# Patient Record
Sex: Male | Born: 1970 | Race: White | Hispanic: No | Marital: Married | Smoking: Never smoker
Health system: Southern US, Community
[De-identification: ages and names within clinical notes are randomized; demographics above are authoritative.]

## PROBLEM LIST (undated history)

## (undated) DIAGNOSIS — F32A Depression, unspecified: Secondary | ICD-10-CM

## (undated) DIAGNOSIS — F329 Major depressive disorder, single episode, unspecified: Secondary | ICD-10-CM

## (undated) HISTORY — DX: Major depressive disorder, single episode, unspecified: F32.9

## (undated) HISTORY — DX: Depression, unspecified: F32.A

## (undated) HISTORY — PX: OTHER SURGICAL HISTORY: SHX169

---
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
## 1998-11-13 HISTORY — PX: OTHER SURGICAL HISTORY: SHX169

## 2002-12-02 ENCOUNTER — Encounter: Payer: Self-pay | Admitting: Emergency Medicine

## 2002-12-02 ENCOUNTER — Emergency Department (HOSPITAL_COMMUNITY): Admission: AC | Admit: 2002-12-02 | Discharge: 2002-12-02 | Payer: Self-pay

## 2006-08-14 ENCOUNTER — Encounter: Payer: Self-pay | Admitting: Family Medicine

## 2006-08-15 ENCOUNTER — Ambulatory Visit: Payer: Self-pay | Admitting: Family Medicine

## 2006-09-22 ENCOUNTER — Ambulatory Visit: Payer: Self-pay | Admitting: Family Medicine

## 2006-09-22 DIAGNOSIS — T50995A Adverse effect of other drugs, medicaments and biological substances, initial encounter: Secondary | ICD-10-CM | POA: Insufficient documentation

## 2006-10-27 ENCOUNTER — Ambulatory Visit: Payer: Self-pay | Admitting: Family Medicine

## 2006-11-24 ENCOUNTER — Ambulatory Visit: Payer: Self-pay | Admitting: Family Medicine

## 2006-12-06 ENCOUNTER — Encounter (INDEPENDENT_AMBULATORY_CARE_PROVIDER_SITE_OTHER): Payer: Self-pay | Admitting: Internal Medicine

## 2006-12-20 ENCOUNTER — Ambulatory Visit: Payer: Self-pay | Admitting: Family Medicine

## 2006-12-20 DIAGNOSIS — B9789 Other viral agents as the cause of diseases classified elsewhere: Secondary | ICD-10-CM | POA: Insufficient documentation

## 2007-09-25 ENCOUNTER — Ambulatory Visit: Payer: Self-pay | Admitting: Family Medicine

## 2007-09-26 ENCOUNTER — Telehealth (INDEPENDENT_AMBULATORY_CARE_PROVIDER_SITE_OTHER): Payer: Self-pay | Admitting: Internal Medicine

## 2007-11-02 ENCOUNTER — Ambulatory Visit: Payer: Self-pay | Admitting: Family Medicine

## 2008-07-17 ENCOUNTER — Ambulatory Visit: Payer: Self-pay | Admitting: Family Medicine

## 2008-07-17 DIAGNOSIS — L989 Disorder of the skin and subcutaneous tissue, unspecified: Secondary | ICD-10-CM | POA: Insufficient documentation

## 2008-08-15 ENCOUNTER — Encounter (INDEPENDENT_AMBULATORY_CARE_PROVIDER_SITE_OTHER): Payer: Self-pay | Admitting: Internal Medicine

## 2008-08-15 ENCOUNTER — Ambulatory Visit: Payer: Self-pay | Admitting: Family Medicine

## 2008-08-15 ENCOUNTER — Encounter: Payer: Self-pay | Admitting: Family Medicine

## 2008-08-21 ENCOUNTER — Ambulatory Visit: Payer: Self-pay | Admitting: Family Medicine

## 2009-03-11 ENCOUNTER — Telehealth (INDEPENDENT_AMBULATORY_CARE_PROVIDER_SITE_OTHER): Payer: Self-pay | Admitting: Internal Medicine

## 2009-03-12 ENCOUNTER — Ambulatory Visit: Payer: Self-pay | Admitting: Family Medicine

## 2009-03-12 DIAGNOSIS — E669 Obesity, unspecified: Secondary | ICD-10-CM | POA: Insufficient documentation

## 2009-08-21 ENCOUNTER — Ambulatory Visit: Payer: Self-pay | Admitting: Family Medicine

## 2009-08-21 DIAGNOSIS — D239 Other benign neoplasm of skin, unspecified: Secondary | ICD-10-CM | POA: Insufficient documentation

## 2009-08-25 ENCOUNTER — Encounter: Payer: Self-pay | Admitting: Family Medicine

## 2009-08-26 ENCOUNTER — Encounter: Payer: Self-pay | Admitting: Family Medicine

## 2009-08-31 ENCOUNTER — Encounter: Payer: Self-pay | Admitting: Family Medicine

## 2009-10-09 ENCOUNTER — Ambulatory Visit: Payer: Self-pay | Admitting: Family Medicine

## 2010-04-15 NOTE — Letter (Signed)
Summary: Generic Letter  [place] [facility]  [address] [place], [place] [address]   Phone: [phone]  Fax: [phone]    08/25/2009  [facility] [address] [place], [place]  [address]  Dear Mr. Guerrette,   We have been unable to contact you by telephone.  Please contact our office regarding your lab results.  When you call, please ask to speak with [facility] Dr. Elmer Sow Medical Assistant.             Sincerely,        Ruthe Mannan, MD

## 2010-04-15 NOTE — Assessment & Plan Note (Signed)
Summary: MOLE REMOVAL R/S PER DR. Travers Goodley/NT   Vital Signs:  Patient profile:   40 year old male Height:      66 inches Weight:      186 pounds BMI:     30.13 Temp:     97.7 degrees F oral Pulse rate:   68 / minute Pulse rhythm:   regular BP sitting:   122 / 80  (left arm) Cuff size:   large  Vitals Entered By: Linde Gillis CMA Duncan Dull) (August 21, 2009 8:34 AM) CC: mole removal   History of Present Illness: 40 yo here for ? suspicous mole on back.  Wife thinks it is changing border and getting darker. No h/o skin cancer or other malignancy.    Current Medications (verified): 1)  Wellbutrin Xl 300 Mg  Tb24 (Bupropion Hcl) .... Take 1 Tablet By Mouth Once A Day 2)  Fish Oil   Oil (Fish Oil) .... One Daily 3)  Vitamin D 400 Unit  Tabs (Cholecalciferol) .... Two Daily 4)  Vitamin C 1000 Mg Tabs (Ascorbic Acid) .... One Daily 5)  Vitamin B Complex-C   Caps (B Complex-C) .... One Daily  Allergies (verified): No Known Drug Allergies  Review of Systems      See HPI General:  Denies fever and malaise. Derm:  Denies poor wound healing.  Physical Exam  General:  alert, well-developed, well-nourished, well-hydrated, and overweight-appearing.   Skin:  multiple seborrheic keratosis and nevi.   One on mid upper back, mildly irregular borders, some darker pigmentation Psych:  normally interactive, good eye contact, not anxious appearing, and not depressed appearing.     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 Tolerated procedure well.  RTC if bleeding increases or concered about infection. Sample sent to path.  Complete Medication List: 1)  Wellbutrin Xl 300 Mg Tb24 (Bupropion hcl) .... Take 1 tablet by mouth once a day 2)  Fish Oil Oil (Fish oil) .... One daily 3)  Vitamin D 400 Unit Tabs (Cholecalciferol) .... Two daily 4)  Vitamin C 1000 Mg Tabs (Ascorbic acid) .... One daily 5)  Vitamin B Complex-c Caps (B  complex-c) .... One daily  Other Orders: Biopsy (Punch) Skin, Single Lesion (11100)   Procedure Note  Biopsy: Biopsy Type: Skin  Procedure # 1: punch biopsy    Size (in cm): 0.3    Location: back-mid-midline    Instrument used: 4mm punch    Anesthesia: 1% lidocaine w/epinephrine  Cleaned and prepped with: betadine Wound dressing: bandaid  Current Allergies (reviewed today): No known allergies

## 2010-04-15 NOTE — Assessment & Plan Note (Signed)
Summary: Joseph Simmons   Vital Signs:  Patient profile:   40 year old male Height:      66 inches Weight:      190.50 pounds BMI:     30.86 Temp:     97.9 degrees F oral Pulse rate:   84 / minute Pulse rhythm:   regular BP sitting:   142 / 100  (left arm) Cuff size:   large  Vitals Entered By: Delilah Shan CMA Tniya Bowditch Dull) (October 09, 2009 8:11 AM)  Serial Vital Signs/Assessments:  Time      Position  BP       Pulse  Resp  Temp     By                     140/90                         Crawford Givens MD  CC: Transfer from BDB   History of Present Illness: Depressed Mood: no Sleep decreased/ Insomnia/Early awakening: prev sleep changes aren't better with meds Interest decreased in activities: no Guilt or worthlessness:no Energy decreased:yes Concentration difficulties:no Appetite disturbance or weight loss:no Psychomotor retardation/agitation:no Suicidal thoughts: no  The decrease in energy seems to be the biggest problem for the patient.  He's asking about taper off meds.   Allergies: No Known Drug Allergies  Past History:  Past Medical History: Last updated: 08/14/2006 Depression (09/1997)  Social History: Last updated: 10/09/2009 Marital Status: Married since 1989 MARRIED LIVES WITH WIFE Children: daughter born 54 Occupation: works at [employer] and receiving [employer] alcohol: occ, likes beer tastings no tob   Family History: Father: : ALIVE born 1594 , HTN, CABG 2011 Mother: ALIVE born 65 Siblings: 2 SISTERS / 1 23YOA/ 18 YOA CV: + P AUNT, CHF HBP + FATHER, + MGM DM: + MGM GOUT/ARTHRITIS:  PROSTATE/CANCER: BREASR/OVARIAN/ UTERINE CANCER:" NEGATIVE DEPRESSION: NEGATIVE ETOH/DRUG ABUSE: + MGM OTHER : + STROKE MGM  Social History: Reviewed history from 07/17/2008 and no changes required. Marital Status: Married since 66 MARRIED LIVES WITH WIFE Children: daughter born 36 Occupation: works at [employer] and receiving [employer] alcohol: occ, likes beer tastings no tob   Review of Systems       See HPI.  Otherwise negative.    Physical Exam  General:  GEN: nad, alert and oriented HEENT: mucous membranes moist NECK: supple w/o LA CV: rrr.  no murmur PULM: ctab, no inc wob ABD: soft, +bs EXT: no edema SKIN: no acute rash, tattoos noted.    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  Report back if needed.  Can restart med if needed.  He agrees with plan.  No SI/HI.  The following medications were removed from the medication list:    Wellbutrin Xl 300 Mg Tb24 (Bupropion hcl) .Marland Kitchen... Take 1 tablet by mouth once a day His updated medication list for this problem includes:    Wellbutrin Sr 150 Mg Xr12h-tab (Bupropion hcl) .Marland Kitchen... 1 by mouth two times a day for 1 week then 1 by mouth qday for 1 week, then stop if tolerated.  Complete Medication List: 1)  Fish Oil Oil (Fish oil) .... One daily 2)  Vitamin D 400 Unit Tabs (Cholecalciferol) .... Two daily 3)  Vitamin C 1000 Mg Tabs (Ascorbic acid) .... One daily 4)  Vitamin B Complex-c Caps (B complex-c) .... One daily 5)  Wellbutrin Sr 150 Mg Xr12h-tab (Bupropion hcl) .Marland Kitchen.. 1 by mouth two times a day for 1 week then 1 by mouth qday for 1 week, then stop if tolerated.  Patient Instructions: 1)  Taper the wellbutrin and let me know if your symptoms get worse.  I would get physical in 04/2010. Prescriptions: WELLBUTRIN SR 150 MG XR12H-TAB (BUPROPION HCL) 1 by mouth two times a day for 1 week then 1 by mouth qday for 1 week, then stop if tolerated.  #30 x 1   Entered and Authorized by:   Crawford Givens MD   Signed by:   Crawford Givens MD on 10/09/2009   Method used:   Electronically to        [facility]* (retail)       [address] [place], [place]   [address]       Ph: [phone] or [phone]       Fax: [phone]   RxID:   [id number]   Current Allergies (reviewed today): No known allergies

## 2010-04-15 NOTE — Miscellaneous (Signed)
Summary: Consent for Punch Biopsy (middle of back)  Consent for Punch Biopsy (middle of back)   Imported By: Beau Fanny 08/21/2009 13:06:17  _____________________________________________________________________  External Attachment:    Type:   Image     Comment:   External Document

## 2010-04-15 NOTE — Consult Note (Signed)
Summary: [facility]  [facility]   Imported By: Lanelle Bal 09/23/2009 13:21:09  _____________________________________________________________________  External Attachment:    Type:   Image     Comment:   External Document

## 2010-04-15 NOTE — Miscellaneous (Signed)
Summary: Orders Update  Clinical Lists Changes  Orders: Added new Referral order of Dermatology Referral (Derma) - Signed 

## 2010-04-21 ENCOUNTER — Telehealth (INDEPENDENT_AMBULATORY_CARE_PROVIDER_SITE_OTHER): Payer: Self-pay | Admitting: *Deleted

## 2010-04-28 ENCOUNTER — Other Ambulatory Visit: Payer: Self-pay | Admitting: Family Medicine

## 2010-04-28 ENCOUNTER — Encounter (INDEPENDENT_AMBULATORY_CARE_PROVIDER_SITE_OTHER): Payer: Self-pay | Admitting: *Deleted

## 2010-04-28 ENCOUNTER — Other Ambulatory Visit (INDEPENDENT_AMBULATORY_CARE_PROVIDER_SITE_OTHER): Payer: BC Managed Care – PPO

## 2010-04-28 DIAGNOSIS — Z8249 Family history of ischemic heart disease and other diseases of the circulatory system: Secondary | ICD-10-CM

## 2010-04-28 DIAGNOSIS — E785 Hyperlipidemia, unspecified: Secondary | ICD-10-CM

## 2010-04-28 LAB — HEPATIC FUNCTION PANEL
ALT: 45 U/L (ref 0–53)
AST: 33 U/L (ref 0–37)
Albumin: 4.2 g/dL (ref 3.5–5.2)
Alkaline Phosphatase: 103 U/L (ref 39–117)
Bilirubin, Direct: 0.1 mg/dL (ref 0.0–0.3)
Total Bilirubin: 0.6 mg/dL (ref 0.3–1.2)
Total Protein: 7.1 g/dL (ref 6.0–8.3)

## 2010-04-28 LAB — LIPID PANEL
Cholesterol: 188 mg/dL (ref 0–200)
VLDL: 56 mg/dL — ABNORMAL HIGH (ref 0.0–40.0)

## 2010-04-28 LAB — BASIC METABOLIC PANEL WITH GFR
BUN: 22 mg/dL (ref 6–23)
CO2: 29 meq/L (ref 19–32)
Calcium: 9.5 mg/dL (ref 8.4–10.5)
Chloride: 103 meq/L (ref 96–112)
Creatinine, Ser: 1.2 mg/dL (ref 0.4–1.5)
GFR: 74.47 mL/min
Glucose, Bld: 185 mg/dL — ABNORMAL HIGH (ref 70–99)
Potassium: 5 meq/L (ref 3.5–5.1)
Sodium: 139 meq/L (ref 135–145)

## 2010-04-29 NOTE — Progress Notes (Signed)
----   Converted from flag ---- ---- 04/20/2010 12:51 PM, Crawford Givens MD wrote: cmet/lipid v17.3.   ---- 04/20/2010 8:31 AM, Liane Comber CMA (AAMA) wrote: Lab orders please! Good Morning! This pt is scheduled for cpx labs Wed, which labs to draw and dx codes to use? Thanks Tasha ------------------------------

## 2010-05-04 ENCOUNTER — Encounter: Payer: Self-pay | Admitting: Family Medicine

## 2010-05-04 ENCOUNTER — Encounter (INDEPENDENT_AMBULATORY_CARE_PROVIDER_SITE_OTHER): Payer: BC Managed Care – PPO | Admitting: Family Medicine

## 2010-05-04 ENCOUNTER — Other Ambulatory Visit: Payer: Self-pay | Admitting: Family Medicine

## 2010-05-04 DIAGNOSIS — R5383 Other fatigue: Secondary | ICD-10-CM

## 2010-05-04 DIAGNOSIS — R5381 Other malaise: Secondary | ICD-10-CM

## 2010-05-04 DIAGNOSIS — Z0000 Encounter for general adult medical examination without abnormal findings: Secondary | ICD-10-CM

## 2010-05-04 DIAGNOSIS — R7309 Other abnormal glucose: Secondary | ICD-10-CM | POA: Insufficient documentation

## 2010-05-04 LAB — CONVERTED CEMR LAB: Blood Glucose, AC Bkfst: 87 mg/dL

## 2010-05-04 LAB — TSH: TSH: 0.8 u[IU]/mL (ref 0.35–5.50)
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
## 2011-06-01 ENCOUNTER — Ambulatory Visit (INDEPENDENT_AMBULATORY_CARE_PROVIDER_SITE_OTHER): Payer: BC Managed Care – PPO | Admitting: Physician Assistant

## 2011-06-01 VITALS — BP 121/82 | HR 73 | Temp 98.2°F | Resp 16 | Ht 66.0 in | Wt 186.6 lb

## 2011-06-01 DIAGNOSIS — R05 Cough: Secondary | ICD-10-CM

## 2011-06-01 DIAGNOSIS — J310 Chronic rhinitis: Secondary | ICD-10-CM

## 2011-06-01 DIAGNOSIS — R059 Cough, unspecified: Secondary | ICD-10-CM

## 2011-06-01 MED ORDER — BENZONATATE 100 MG PO CAPS: ORAL_CAPSULE | ORAL | Status: AC

## 2011-06-01 MED ORDER — GUAIFENESIN ER 1200 MG PO TB12: 1.0000 | ORAL_TABLET | Freq: Two times a day (BID) | ORAL | Status: DC | PRN

## 2011-06-01 MED ORDER — AZITHROMYCIN 250 MG PO TABS: ORAL_TABLET | ORAL | Status: AC

## 2011-06-01 MED ORDER — IPRATROPIUM BROMIDE 0.03 % NA SOLN: 2.0000 | Freq: Two times a day (BID) | NASAL | Status: DC

## 2011-06-01 NOTE — Patient Instructions (Signed)
Get lots of rest and drink at least 64 ounces of water daily.  If your symptoms do not begin to improve over the next 24-48 hours, fill the prescription for the antibiotic.

## 2011-06-01 NOTE — Progress Notes (Signed)
  Subjective:    Patient ID: Joseph Simmons, male    DOB: 10/27/1970, 41 y.o.   MRN: [id number]  HPI  5 days of illness.  Gradual onset of symptoms.  Thought it was a "cold or the flu, but I'm not getting any better."  Subjective fever, no nausea, vomiting, a little diarrhea initially. Mild muscle aches.  Nasal congestion, post-nasal drainage, cough (mildly productive, moreso at night).  ST and otaligia initially, now resolved.  Wife was diagnosed with "bacterial pneumonia" last night.  Review of Systems As above.    Objective:   Physical Exam  Vital signs noted. Well-developed, well nourished WM who is awake, alert and oriented, in NAD. HEENT: [place]/AT, sclera and conjunctiva are clear.  EAC are patent, TMs are normal in appearance. Nasal mucosa is pink and moist. OP is clear. Neck: supple, non-tender, no lymphadenopathy, thyromegaly. Heart: RRR, no murmur Lungs: CTA Extremities: no cyanosis, clubbing or edema. Skin: warm and dry without rash.       Assessment & Plan:  Viral URI vs. Early sinobronchitis.  1. Cough  benzonatate (TESSALON) 100 MG capsule  2. Rhinitis  ipratropium (ATROVENT) 0.03 % nasal spray, Guaifenesin (MUCINEX MAXIMUM STRENGTH) 1200 MG TB12   If no improvement over the next 48 hours, fill Rx for Zpak.  Supportive care.

## 2011-08-17 ENCOUNTER — Other Ambulatory Visit: Payer: Self-pay | Admitting: Family Medicine

## 2011-08-17 DIAGNOSIS — R739 Hyperglycemia, unspecified: Secondary | ICD-10-CM

## 2011-08-26 ENCOUNTER — Other Ambulatory Visit (INDEPENDENT_AMBULATORY_CARE_PROVIDER_SITE_OTHER): Payer: BC Managed Care – PPO

## 2011-08-26 DIAGNOSIS — R739 Hyperglycemia, unspecified: Secondary | ICD-10-CM

## 2011-08-26 DIAGNOSIS — R7309 Other abnormal glucose: Secondary | ICD-10-CM

## 2011-08-26 LAB — BASIC METABOLIC PANEL
BUN: 17 mg/dL (ref 6–23)
Chloride: 106 mEq/L (ref 96–112)
GFR: 74.74 mL/min (ref >60.00)
Potassium: 4.7 mEq/L (ref 3.5–5.1)
Sodium: 140 mEq/L (ref 135–145)

## 2011-08-26 LAB — LIPID PANEL
Cholesterol: 178 mg/dL (ref 0–200)
LDL Cholesterol: 126 mg/dL — ABNORMAL HIGH (ref 0–99)
Total CHOL/HDL Ratio: 5

## 2011-08-29 ENCOUNTER — Encounter: Payer: Self-pay | Admitting: Family Medicine

## 2011-09-02 ENCOUNTER — Ambulatory Visit (INDEPENDENT_AMBULATORY_CARE_PROVIDER_SITE_OTHER): Payer: BC Managed Care – PPO | Admitting: Family Medicine

## 2011-09-02 ENCOUNTER — Encounter: Payer: Self-pay | Admitting: Family Medicine

## 2011-09-02 VITALS — BP 104/80 | HR 69 | Temp 97.8°F | Wt 183.0 lb

## 2011-09-02 DIAGNOSIS — R5381 Other malaise: Secondary | ICD-10-CM

## 2011-09-02 DIAGNOSIS — Z0000 Encounter for general adult medical examination without abnormal findings: Secondary | ICD-10-CM

## 2011-09-02 DIAGNOSIS — Z86018 Personal history of other benign neoplasm: Secondary | ICD-10-CM

## 2011-09-02 DIAGNOSIS — Z872 Personal history of diseases of the skin and subcutaneous tissue: Secondary | ICD-10-CM

## 2011-09-02 DIAGNOSIS — Z8042 Family history of malignant neoplasm of prostate: Secondary | ICD-10-CM

## 2011-09-02 DIAGNOSIS — D239 Other benign neoplasm of skin, unspecified: Secondary | ICD-10-CM

## 2011-09-02 DIAGNOSIS — S069XAA Unspecified intracranial injury with loss of consciousness status unknown, initial encounter: Secondary | ICD-10-CM | POA: Insufficient documentation

## 2011-09-02 DIAGNOSIS — S069X9A Unspecified intracranial injury with loss of consciousness of unspecified duration, initial encounter: Secondary | ICD-10-CM

## 2011-09-02 DIAGNOSIS — R5383 Other fatigue: Secondary | ICD-10-CM

## 2011-09-02 NOTE — Assessment & Plan Note (Signed)
Routine anticipatory guidance given to patient.  See health maintenance. Labs d/w pt.   Tetanus 2010 Flu shot encouraged

## 2011-09-02 NOTE — Patient Instructions (Addendum)
I would get a flu shot each fall.   See Shirlee Limerick about your referral before you leave today. Take care.  If the fatigue gets worse, or if you notice other symptoms, let me know.  Glad to see you.
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
## 2011-09-02 NOTE — Assessment & Plan Note (Signed)
Pt has no symptoms.  D/w pt.  No screening needed now.
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
## 2011-09-02 NOTE — Assessment & Plan Note (Signed)
H/o atypical nevus.  Would refer back to derm for eval. Pt agrees.

## 2015-10-16 DIAGNOSIS — L814 Other melanin hyperpigmentation: Secondary | ICD-10-CM | POA: Diagnosis not present

## 2015-10-16 DIAGNOSIS — D235 Other benign neoplasm of skin of trunk: Secondary | ICD-10-CM | POA: Diagnosis not present

## 2015-10-16 DIAGNOSIS — L259 Unspecified contact dermatitis, unspecified cause: Secondary | ICD-10-CM | POA: Diagnosis not present

## 2016-02-01 DIAGNOSIS — E785 Hyperlipidemia, unspecified: Secondary | ICD-10-CM | POA: Diagnosis not present

## 2016-02-01 DIAGNOSIS — Z0000 Encounter for general adult medical examination without abnormal findings: Secondary | ICD-10-CM | POA: Diagnosis not present

## 2016-02-01 DIAGNOSIS — Z23 Encounter for immunization: Secondary | ICD-10-CM | POA: Diagnosis not present

## 2016-02-01 DIAGNOSIS — Z125 Encounter for screening for malignant neoplasm of prostate: Secondary | ICD-10-CM | POA: Diagnosis not present

## 2016-02-01 DIAGNOSIS — R739 Hyperglycemia, unspecified: Secondary | ICD-10-CM | POA: Diagnosis not present

## 2016-10-14 DIAGNOSIS — L309 Dermatitis, unspecified: Secondary | ICD-10-CM | POA: Diagnosis not present

## 2016-10-14 DIAGNOSIS — D1801 Hemangioma of skin and subcutaneous tissue: Secondary | ICD-10-CM | POA: Diagnosis not present

## 2016-10-14 DIAGNOSIS — L814 Other melanin hyperpigmentation: Secondary | ICD-10-CM | POA: Diagnosis not present

## 2016-10-14 DIAGNOSIS — D225 Melanocytic nevi of trunk: Secondary | ICD-10-CM | POA: Diagnosis not present

## 2016-10-27 ENCOUNTER — Encounter: Payer: Self-pay | Admitting: Emergency Medicine

## 2016-10-27 ENCOUNTER — Ambulatory Visit (INDEPENDENT_AMBULATORY_CARE_PROVIDER_SITE_OTHER): Payer: Worker's Compensation

## 2016-10-27 ENCOUNTER — Ambulatory Visit (INDEPENDENT_AMBULATORY_CARE_PROVIDER_SITE_OTHER): Payer: Worker's Compensation | Admitting: Emergency Medicine

## 2016-10-27 VITALS — BP 142/86 | HR 74 | Temp 98.5°F | Resp 16 | Ht 67.13 in | Wt 191.0 lb

## 2016-10-27 DIAGNOSIS — S43402A Unspecified sprain of left shoulder joint, initial encounter: Secondary | ICD-10-CM

## 2016-10-27 DIAGNOSIS — M25512 Pain in left shoulder: Secondary | ICD-10-CM | POA: Insufficient documentation

## 2016-10-27 MED ORDER — DICLOFENAC SODIUM 75 MG PO TBEC: 75.0000 mg | DELAYED_RELEASE_TABLET | Freq: Two times a day (BID) | ORAL | 0 refills | Status: AC
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
## 2016-10-27 NOTE — Patient Instructions (Addendum)
   IF you received an x-ray today, you will receive an invoice from [facility]. Please contact  Radiology at [phone] with questions or concerns regarding your invoice.   IF you received labwork today, you will receive an invoice from [facility]. Please contact [facility] at [phone] with questions or concerns regarding your invoice.   Our billing staff will not be able to assist you with questions regarding bills from these companies.  You will be contacted with the lab results as soon as they are available. The fastest way to get your results is to activate your My Chart account. Instructions are located on the last page of this paperwork. If you have not heard from us regarding the results in 2 weeks, please contact this office.      Shoulder Sprain A shoulder sprain is a partial or complete tear in one of the tough, fiber-like tissues (ligaments) in the shoulder. The ligaments in the shoulder help to hold the shoulder in place. What are the causes? This condition may be caused by:  A fall.  A hit to the shoulder.  A twist of the arm. What increases the risk? This condition is more likely to develop in:  People who play sports.  People who have problems with balance or coordination. What are the signs or symptoms? Symptoms of this condition include:  Pain when moving the shoulder.  Limited ability to move the shoulder.  Swelling and tenderness on top of the shoulder.  Warmth in the shoulder.  A change in the shape of the shoulder.  Redness or bruising on the shoulder. How is this diagnosed? This condition is diagnosed with a physical exam. During the exam, you may be asked to do simple exercises with your shoulder. You may also have imaging tests, such as X-rays, MRI, or a CT scan. These tests can show how severe the sprain is. How is this treated? This condition may be treated with:  Rest.  Pain medicine.  Ice.  A sling or brace.  This is used to keep the arm still while the shoulder is healing.  Physical therapy or rehabilitation exercises. These help to improve the range of motion and strength of the shoulder.  Surgery (rare). Surgery may be needed if the sprain caused a joint to become unstable. Surgery may also be needed to reduce pain. Some people may develop ongoing shoulder pain or lose some range of motion in the shoulder. However, most people do not develop long-term problems. Follow these instructions at home:  Rest.  Ask your health care provider when it is safe for you to drive if you have a sling or brace on your shoulder.  Take over-the-counter and prescription medicines only as told by your health care provider.  If directed, apply ice to the area:  Put ice in a plastic bag.  Place a towel between your skin and the bag.  Leave the ice on for 20 minutes, 2-3 times per day.  If you were given a shoulder sling or brace:  Wear it as told.  Remove it to shower or bathe.  Move your arm only as much as told by your health care provider, but keep your hand moving to prevent swelling.  If you were shown how to do any exercises, do them as told by your health care provider.  Keep all follow-up visits as told by your health care provider. This is important. Contact a health care provider if:  Your pain gets worse.  Your   pain is not relieved with medicines.  You have increased redness or swelling. Get help right away if:  You have a fever.  You cannot move your arm or shoulder.  You develop severe numbness or tingling in your arm, hand, or fingers.  Your arm, hand, or fingers turn blue, white, or gray and feel cold. This information is not intended to replace advice given to you by your health care provider. Make sure you discuss any questions you have with your health care provider. Document Released: 07/17/2008 Document Revised: 10/25/2015 Document Reviewed: 06/23/2014 Elsevier  Interactive Patient Education  2017 Elsevier Inc.  

## 2016-10-31 ENCOUNTER — Other Ambulatory Visit: Payer: Self-pay | Admitting: Emergency Medicine

## 2017-01-31 DIAGNOSIS — E785 Hyperlipidemia, unspecified: Secondary | ICD-10-CM | POA: Diagnosis not present

## 2017-01-31 DIAGNOSIS — I10 Essential (primary) hypertension: Secondary | ICD-10-CM | POA: Diagnosis not present

## 2017-01-31 DIAGNOSIS — R739 Hyperglycemia, unspecified: Secondary | ICD-10-CM | POA: Diagnosis not present

## 2017-02-08 DIAGNOSIS — Z0000 Encounter for general adult medical examination without abnormal findings: Secondary | ICD-10-CM | POA: Diagnosis not present

## 2017-02-08 DIAGNOSIS — E785 Hyperlipidemia, unspecified: Secondary | ICD-10-CM | POA: Diagnosis not present

## 2017-02-08 DIAGNOSIS — Z125 Encounter for screening for malignant neoplasm of prostate: Secondary | ICD-10-CM | POA: Diagnosis not present

## 2017-02-08 DIAGNOSIS — I10 Essential (primary) hypertension: Secondary | ICD-10-CM | POA: Diagnosis not present

## 2017-02-08 DIAGNOSIS — R7303 Prediabetes: Secondary | ICD-10-CM | POA: Diagnosis not present

## 2017-04-20 DIAGNOSIS — J111 Influenza due to unidentified influenza virus with other respiratory manifestations: Secondary | ICD-10-CM | POA: Diagnosis not present

## 2017-04-20 DIAGNOSIS — J029 Acute pharyngitis, unspecified: Secondary | ICD-10-CM | POA: Diagnosis not present

## 2017-10-16 DIAGNOSIS — L821 Other seborrheic keratosis: Secondary | ICD-10-CM | POA: Diagnosis not present

## 2017-10-16 DIAGNOSIS — D1801 Hemangioma of skin and subcutaneous tissue: Secondary | ICD-10-CM | POA: Diagnosis not present

## 2017-10-16 DIAGNOSIS — Z872 Personal history of diseases of the skin and subcutaneous tissue: Secondary | ICD-10-CM | POA: Diagnosis not present

## 2017-10-16 DIAGNOSIS — D225 Melanocytic nevi of trunk: Secondary | ICD-10-CM | POA: Diagnosis not present

## 2018-08-05 IMAGING — DX DG SHOULDER 2+V*L*
3 series · 3 of 3 positions shown · non-contrast
Comparison: None.

CLINICAL DATA: Acute left shoulder pain

EXAM:
LEFT SHOULDER - 2+ VIEW

[shoulder ap]
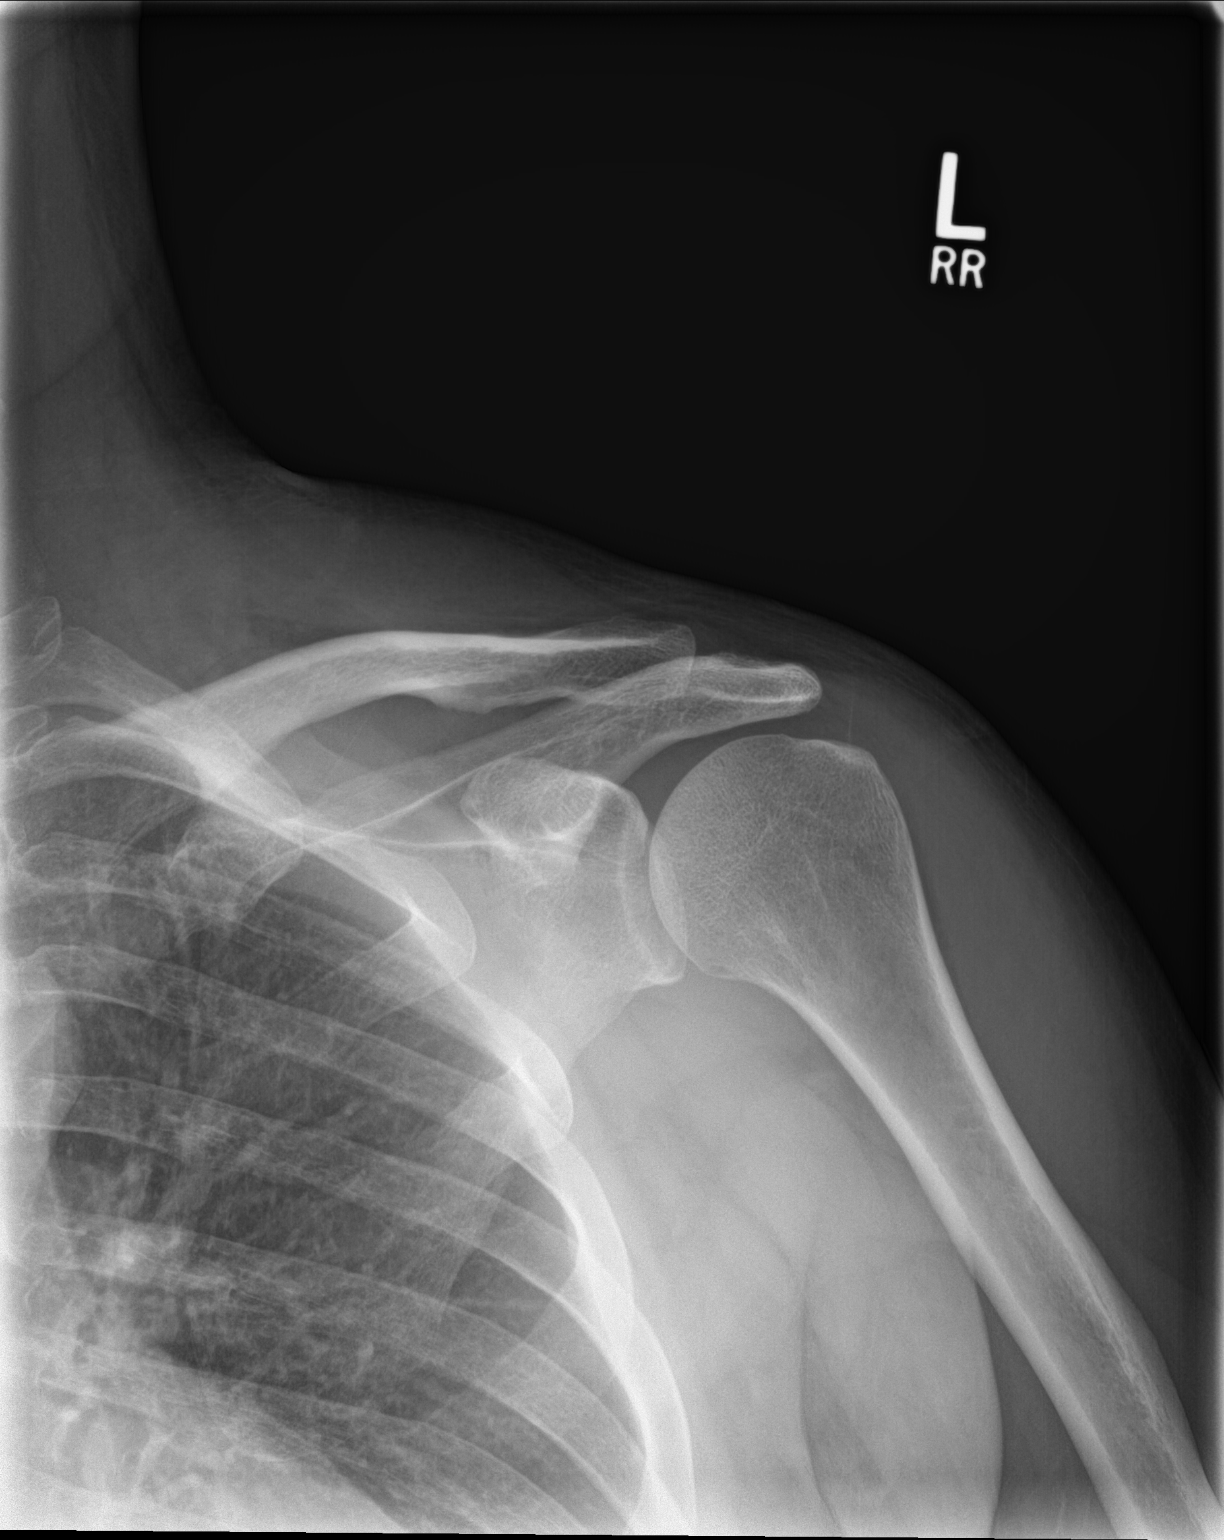

[shoulder y-view]
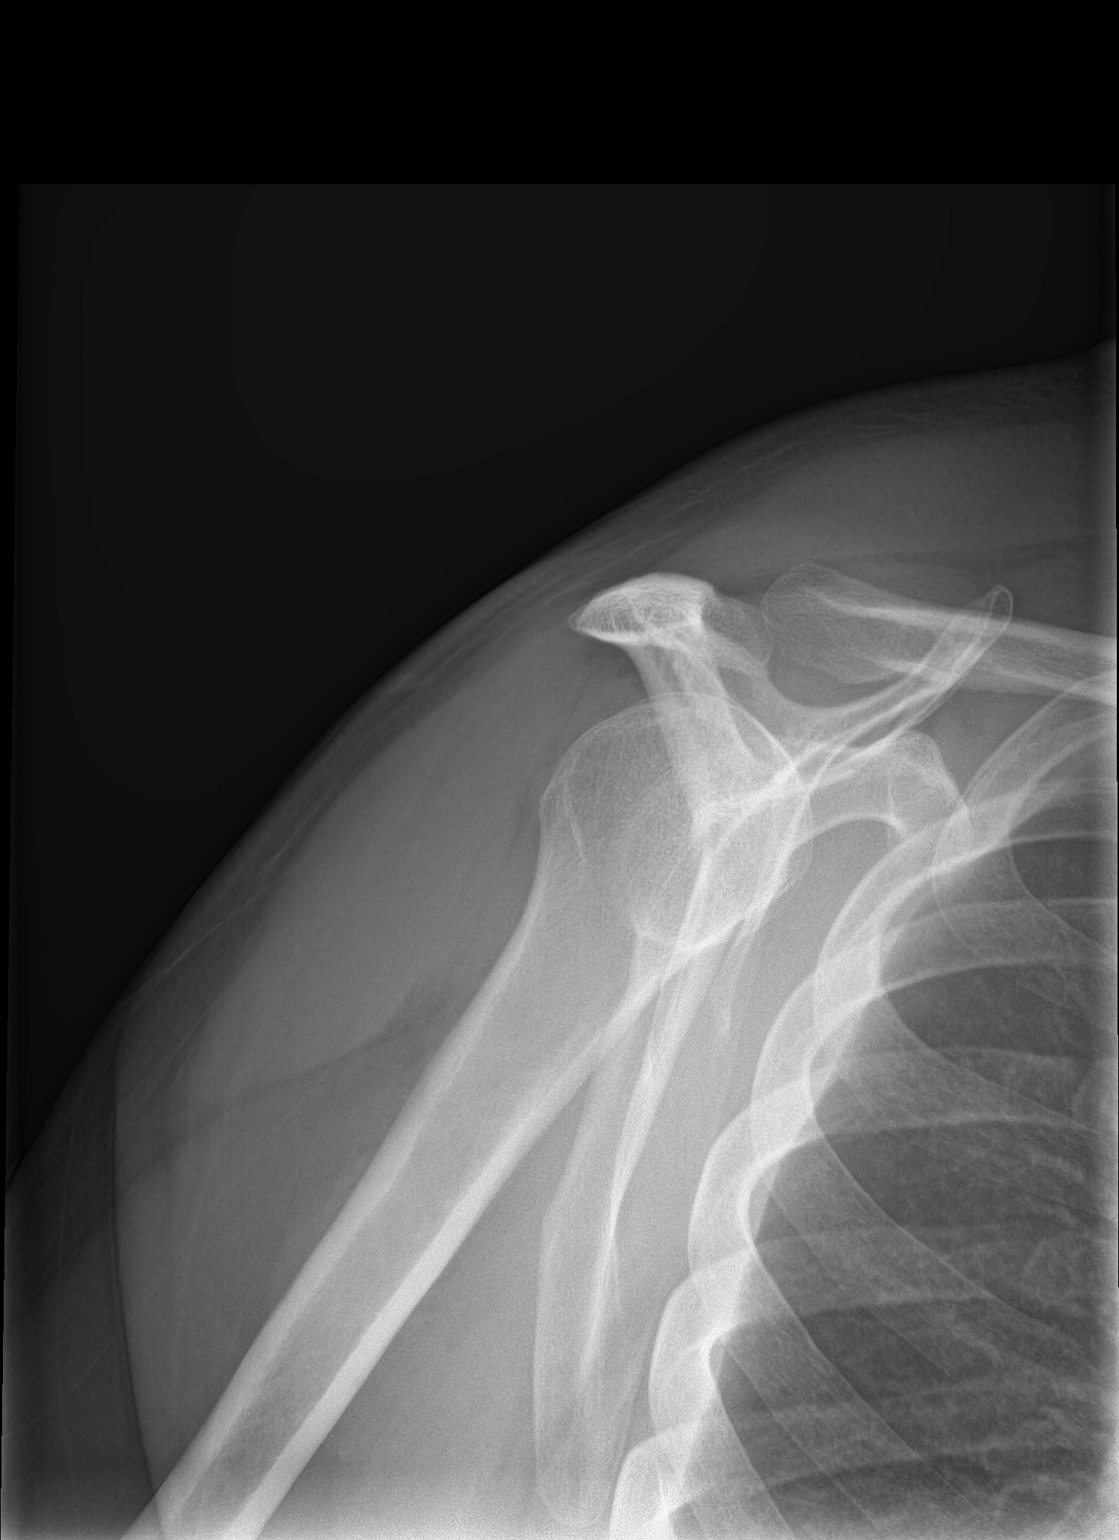

[shoulder axial]
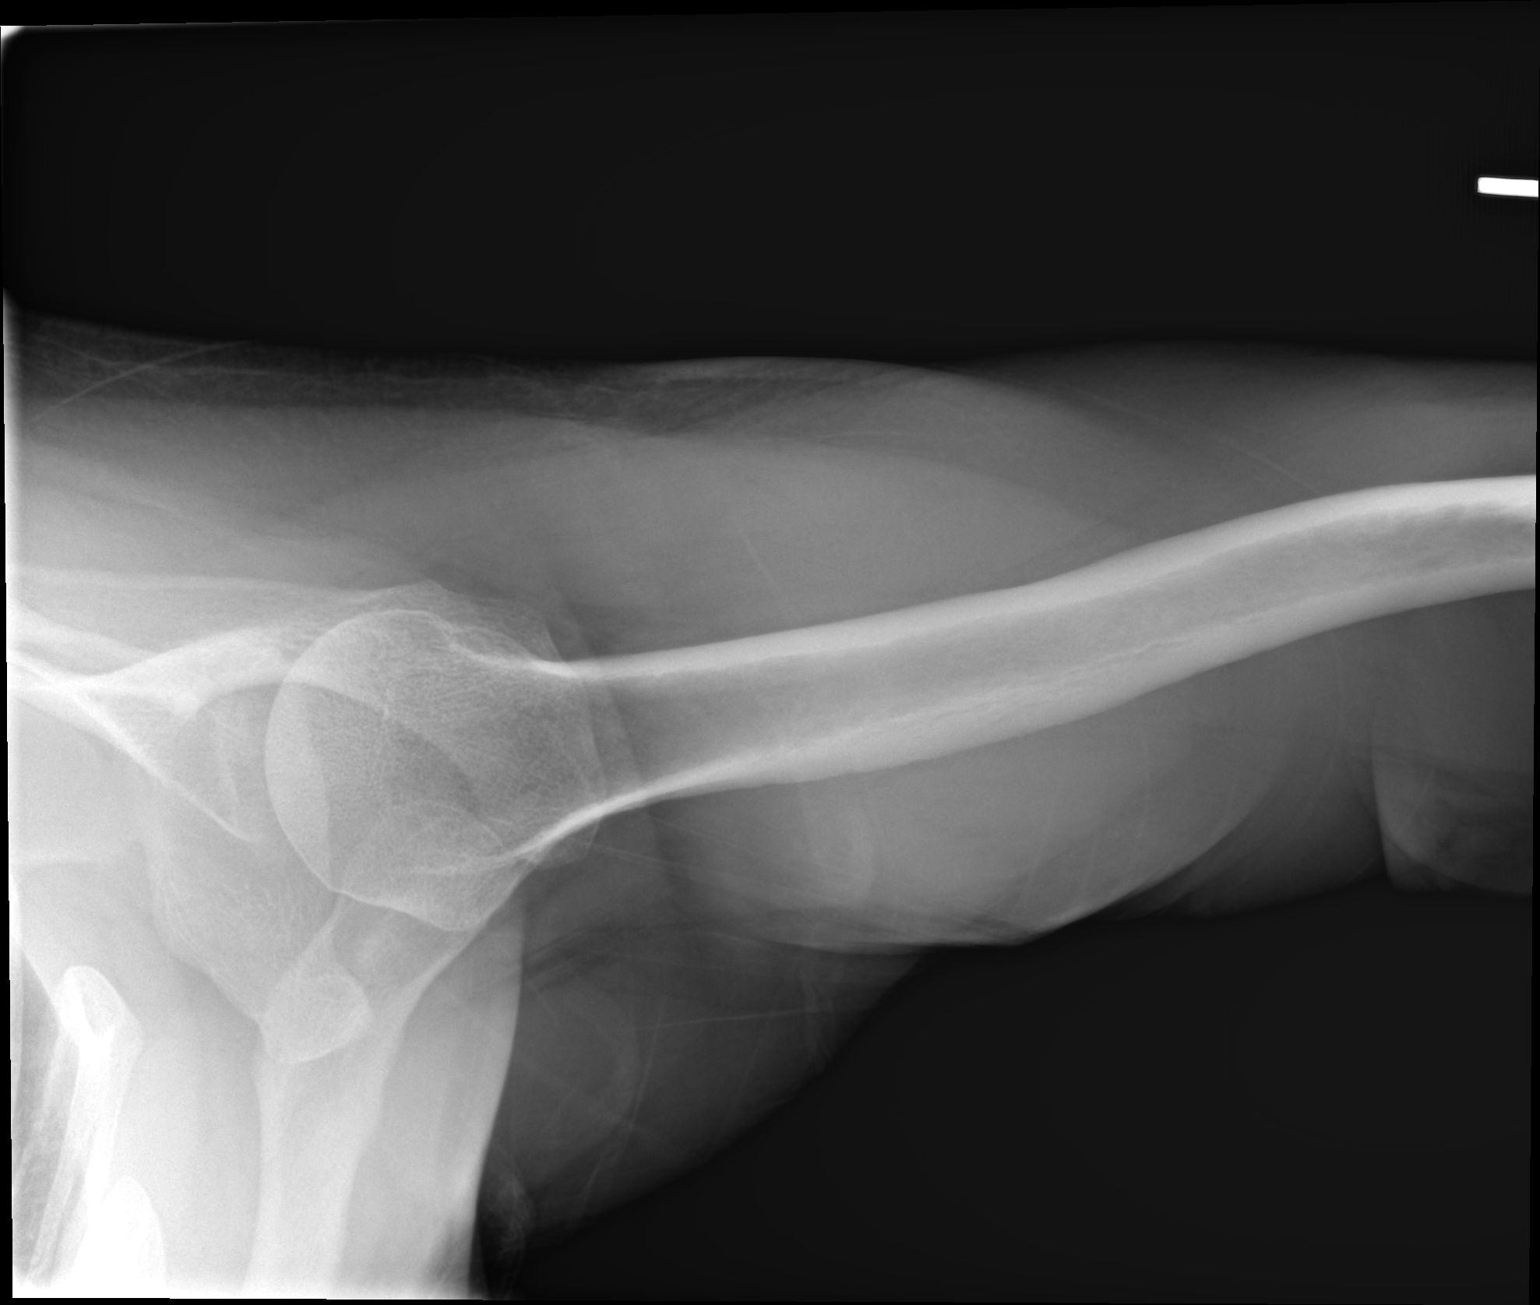

[3 of 3 positions shown; findings below may reference images not displayed]

FINDINGS: No acute bony abnormality. Specifically, no fracture, subluxation,
or dislocation. Soft tissues are intact. Joint spaces are
maintained.
IMPRESSION: Negative.

## 2018-10-17 DIAGNOSIS — D225 Melanocytic nevi of trunk: Secondary | ICD-10-CM | POA: Diagnosis not present

## 2018-10-17 DIAGNOSIS — Z872 Personal history of diseases of the skin and subcutaneous tissue: Secondary | ICD-10-CM | POA: Diagnosis not present

## 2018-10-17 DIAGNOSIS — D485 Neoplasm of uncertain behavior of skin: Secondary | ICD-10-CM | POA: Diagnosis not present

## 2018-10-17 DIAGNOSIS — L821 Other seborrheic keratosis: Secondary | ICD-10-CM | POA: Diagnosis not present

## 2018-10-17 DIAGNOSIS — D224 Melanocytic nevi of scalp and neck: Secondary | ICD-10-CM | POA: Diagnosis not present

## 2018-10-17 DIAGNOSIS — D1801 Hemangioma of skin and subcutaneous tissue: Secondary | ICD-10-CM | POA: Diagnosis not present

## 2019-08-06 DIAGNOSIS — E785 Hyperlipidemia, unspecified: Secondary | ICD-10-CM | POA: Diagnosis not present

## 2019-08-06 DIAGNOSIS — Z0000 Encounter for general adult medical examination without abnormal findings: Secondary | ICD-10-CM | POA: Diagnosis not present

## 2019-08-06 DIAGNOSIS — Z125 Encounter for screening for malignant neoplasm of prostate: Secondary | ICD-10-CM | POA: Diagnosis not present

## 2019-08-06 DIAGNOSIS — R7303 Prediabetes: Secondary | ICD-10-CM | POA: Diagnosis not present

## 2019-08-06 DIAGNOSIS — I10 Essential (primary) hypertension: Secondary | ICD-10-CM | POA: Diagnosis not present

## 2020-08-25 DIAGNOSIS — E785 Hyperlipidemia, unspecified: Secondary | ICD-10-CM | POA: Diagnosis not present

## 2020-08-25 DIAGNOSIS — Z125 Encounter for screening for malignant neoplasm of prostate: Secondary | ICD-10-CM | POA: Diagnosis not present

## 2020-08-25 DIAGNOSIS — E1169 Type 2 diabetes mellitus with other specified complication: Secondary | ICD-10-CM | POA: Diagnosis not present

## 2020-08-25 DIAGNOSIS — I10 Essential (primary) hypertension: Secondary | ICD-10-CM | POA: Diagnosis not present

## 2020-08-25 DIAGNOSIS — Z0000 Encounter for general adult medical examination without abnormal findings: Secondary | ICD-10-CM | POA: Diagnosis not present

## 2021-10-06 DIAGNOSIS — I10 Essential (primary) hypertension: Secondary | ICD-10-CM | POA: Diagnosis not present

## 2021-10-06 DIAGNOSIS — E1169 Type 2 diabetes mellitus with other specified complication: Secondary | ICD-10-CM | POA: Diagnosis not present

## 2021-10-06 DIAGNOSIS — M25561 Pain in right knee: Secondary | ICD-10-CM | POA: Diagnosis not present

## 2021-10-06 DIAGNOSIS — Z0000 Encounter for general adult medical examination without abnormal findings: Secondary | ICD-10-CM | POA: Diagnosis not present

## 2021-10-06 DIAGNOSIS — Z125 Encounter for screening for malignant neoplasm of prostate: Secondary | ICD-10-CM | POA: Diagnosis not present

## 2021-10-06 DIAGNOSIS — E785 Hyperlipidemia, unspecified: Secondary | ICD-10-CM | POA: Diagnosis not present
